# Patient Record
Sex: Female | Born: 1985 | Race: Asian | Hispanic: No | Marital: Single
Health system: Northeastern US, Academic
[De-identification: ages and names within clinical notes are randomized; demographics above are authoritative.]

## PROBLEM LIST (undated history)

## (undated) DIAGNOSIS — T7840XA Allergy, unspecified, initial encounter: Secondary | ICD-10-CM

## (undated) HISTORY — PX: EYE SURGERY: SHX253

## (undated) HISTORY — DX: Allergy, unspecified, initial encounter: T78.40XA

---

## 2012-03-18 ENCOUNTER — Ambulatory Visit: Payer: Self-pay | Admitting: Emergency Medicine

## 2012-03-18 VITALS — BP 107/72 | HR 84 | Temp 98.1°F | Resp 16 | Ht 66.0 in | Wt 121.0 lb

## 2012-03-18 DIAGNOSIS — H1010 Acute atopic conjunctivitis, unspecified eye: Secondary | ICD-10-CM

## 2012-03-18 MED ORDER — OLOPATADINE HCL 0.1 % OP SOLN: 1.0000 [drp] | Freq: Two times a day (BID) | OPHTHALMIC | Status: AC

## 2012-03-18 NOTE — Patient Instructions (Signed)

## 2012-03-18 NOTE — Progress Notes (Signed)
[facility] [address] [place] [address] [phone]  Date:  03/18/2012   Name:  Theresa Myers   DOB:  1985/04/14   MRN:  [id number]  PCP:  No primary provider on file.    Chief Complaint: Allergic Reaction   History of Present Illness:  Theresa Myers is a 27 y.o. very pleasant female patient who presents with the following:  Has allergy to cat and dog dander that is mild and not usually having allergic symptoms.  Noticed swelling in left eye suddenly today and took some benadryl.  Improved over the next few hours.  Had no visual symptoms but conjunctival injection and lid edema.  No wheezing, cough, difficulty breathing or shortness of breath.  No rash.  There is no problem list on file for this patient.   Past Medical History  Diagnosis Date  . Allergy     Past Surgical History  Procedure Date  . Eye surgery     History  Substance Use Topics  . Smoking status: Never Smoker   . Smokeless tobacco: Never Used  . Alcohol Use: No    History reviewed. No pertinent family history.  No Known Allergies  Medication list has been reviewed and updated.  Current Outpatient Prescriptions on File Prior to Visit  Medication Sig Dispense Refill  . cetirizine (ZYRTEC) 10 MG tablet Take 10 mg by mouth daily.        Review of Systems:  As per HPI, otherwise negative.    Physical Examination: Filed Vitals:   03/18/12 1403  BP: 107/72  Pulse: 84  Temp: 98.1 F (36.7 C)  Resp: 16   Filed Vitals:   03/18/12 1403  Height: 5\' 6"  (1.676 m)  Weight: 121 lb (54.885 kg)   Body mass index is 19.53 kg/(m^2). Ideal Body Weight: Weight in (lb) to have BMI = 25: 154.6   GEN: WDWN, NAD, Non-toxic, A & O x 3 HEENT: Atraumatic, Normocephalic. Neck supple. No masses, No LAD.  Injection left conjunctiva.  No drainage. Some lid edema. Ears and Nose: No external deformity. CV: RRR, No M/G/R. No JVD. No thrill. No extra heart sounds. PULM: CTA  B, no wheezes, crackles, rhonchi. No retractions. No resp. distress. No accessory muscle use. ABD: S, NT, ND, +BS. No rebound. No HSM. EXTR: No c/c/e NEURO Normal gait.  PSYCH: Normally interactive. Conversant. Not depressed or anxious appearing.  Calm demeanor.    Assessment and Plan: Atopic conjunctivitis patanol Follow up as needed  Carmelina Dane, MD

## 2013-07-12 ENCOUNTER — Ambulatory Visit (INDEPENDENT_AMBULATORY_CARE_PROVIDER_SITE_OTHER): Payer: Managed Care, Other (non HMO) | Admitting: Family Medicine

## 2013-07-12 VITALS — BP 98/64 | HR 79 | Temp 98.1°F | Resp 16 | Ht 66.0 in | Wt 114.8 lb

## 2013-07-12 DIAGNOSIS — R350 Frequency of micturition: Secondary | ICD-10-CM

## 2013-07-12 LAB — POCT URINE PREGNANCY: Preg Test, Ur: NEGATIVE

## 2013-07-12 LAB — POCT URINALYSIS DIPSTICK
Bilirubin, UA: NEGATIVE
Glucose, UA: NEGATIVE
Ketones, UA: NEGATIVE
Leukocytes, UA: NEGATIVE
Nitrite, UA: NEGATIVE
Protein, UA: NEGATIVE
Spec Grav, UA: 1.015
Urobilinogen, UA: 0.2
pH, UA: 5.5

## 2013-07-12 MED ORDER — CIPROFLOXACIN HCL 250 MG PO TABS: 250.0000 mg | ORAL_TABLET | Freq: Two times a day (BID) | ORAL | Status: AC

## 2013-07-12 NOTE — Progress Notes (Addendum)
° °  Subjective:  This chart was scribed for Elvina SidleKurt Lauenstein, MD  by Ashley JacobsBrittany Andrews, [facility] Scribe. The patient was seen in room and the patient's care was started at 1:56 PM.  Patient ID: Theresa Myers, female    DOB: [birth year], 28 y.o.   MRN: [id number]  07/12/2013  Urinary Frequency   Urinary Frequency  Associated symptoms include frequency. Pertinent negatives include no discharge, hematuria, nausea or vomiting. There is no history of recurrent UTIs.   HPI Comments: Theresa Myers is a 28 y.o. female who [facility] complaining of urinary frequency since last night. Pt mentions that yesterday she has associated lower abdomen discomfort, pale/yellow/clear urination, no discharge or odor, and diffuse lumbar aching. Prior to her symptoms she suspect that she had a "stomach bug" that lasted approximately 16 hours. She had abdominal cramps, nausea, vomiting, and diarrhea which has since resolved. She reports drinking plenty of fluids and drinking chicken broth. Denies prior UTI.   She is a Research scientist (life sciences)pastry chef.  Review of Systems  Gastrointestinal: Positive for abdominal pain. Negative for nausea, vomiting and diarrhea.  Genitourinary: Positive for frequency. Negative for hematuria and vaginal discharge.  Musculoskeletal: Positive for back pain.  All other systems reviewed and are negative.   Past Medical History  Diagnosis Date   Allergy    No Known Allergies Current Outpatient Prescriptions  Medication Sig Dispense Refill   cetirizine (ZYRTEC) 10 MG tablet Take 10 mg by mouth daily.       olopatadine (PATANOL) 0.1 % ophthalmic solution Place 1 drop into the left eye 2 (two) times daily.  5 mL  12   No current facility-administered medications for this visit.       Objective:    BP 98/64   Pulse 79   Temp(Src) 98.1 F (36.7 C) (Oral)   Resp 16   Ht 5\' 6"  (1.676 m)   Wt 114 lb 12.8 oz (52.073 kg)   BMI 18.54 kg/m2    SpO2 98%   LMP 07/10/2013 Physical Exam No acute distress Thin adult woman who is cooperative. HEENT: Unremarkable No CVA tenderness Abdomen soft, nontender without HSM   Assessment & Plan:   Urinary frequency - Plan: POCT urine pregnancy, POCT urinalysis dipstick, Urine culture, ciprofloxacin (CIPRO) 250 MG tablet  Signed, Elvina SidleKurt Lauenstein, MD

## 2013-07-14 ENCOUNTER — Telehealth: Payer: Self-pay

## 2013-07-14 ENCOUNTER — Ambulatory Visit (INDEPENDENT_AMBULATORY_CARE_PROVIDER_SITE_OTHER): Payer: Managed Care, Other (non HMO) | Admitting: Physician Assistant

## 2013-07-14 VITALS — BP 100/70 | HR 96 | Temp 98.2°F | Resp 16 | Ht 65.5 in | Wt 114.0 lb

## 2013-07-14 DIAGNOSIS — R319 Hematuria, unspecified: Secondary | ICD-10-CM

## 2013-07-14 DIAGNOSIS — R350 Frequency of micturition: Secondary | ICD-10-CM

## 2013-07-14 DIAGNOSIS — N12 Tubulo-interstitial nephritis, not specified as acute or chronic: Secondary | ICD-10-CM

## 2013-07-14 DIAGNOSIS — R300 Dysuria: Secondary | ICD-10-CM

## 2013-07-14 LAB — POCT CBC
Granulocyte percent: 77.4 %G (ref 37–80)
HCT, POC: 42.2 % (ref 37.7–47.9)
Hemoglobin: 14 g/dL (ref 12.2–16.2)
Lymph, poc: 1.4 (ref 0.6–3.4)
MCH, POC: 32.5 pg — AB (ref 27–31.2)
MCHC: 33.2 g/dL (ref 31.8–35.4)
MCV: 97.9 fL — AB (ref 80–97)
MID (cbc): 0.4 (ref 0–0.9)
MPV: 8.1 fL (ref 0–99.8)
POC Granulocyte: 6.2 (ref 2–6.9)
POC LYMPH PERCENT: 18.1 %L (ref 10–50)
POC MID %: 4.5 %M (ref 0–12)
Platelet Count, POC: 288 10*3/uL (ref 142–424)
RBC: 4.31 M/uL (ref 4.04–5.48)
RDW, POC: 12.9 %
WBC: 8 10*3/uL (ref 4.6–10.2)

## 2013-07-14 LAB — POCT URINALYSIS DIPSTICK
Bilirubin, UA: NEGATIVE
Glucose, UA: NEGATIVE
Ketones, UA: NEGATIVE
Nitrite, UA: NEGATIVE
PROTEIN UA: 100
UROBILINOGEN UA: 0.2
pH, UA: 5.5

## 2013-07-14 LAB — POCT UA - MICROSCOPIC ONLY
CRYSTALS, UR, HPF, POC: NEGATIVE
Casts, Ur, LPF, POC: NEGATIVE
Mucus, UA: NEGATIVE
YEAST UA: NEGATIVE

## 2013-07-14 LAB — URINE CULTURE: Colony Count: >=100000

## 2013-07-14 MED ORDER — AMOXICILLIN-POT CLAVULANATE 875-125 MG PO TABS: 1.0000 | ORAL_TABLET | Freq: Two times a day (BID) | ORAL | Status: AC

## 2013-07-14 MED ORDER — CEFTRIAXONE SODIUM 1 G IJ SOLR
1.0000 g | Freq: Once | INTRAMUSCULAR | Status: AC
  Administered 2013-07-14: 1 g via INTRAMUSCULAR

## 2013-07-14 NOTE — Telephone Encounter (Signed)
PT CALLED, STATES WAS SEEN BY DR Milus GlazierLAUENSTEIN ON Tuesday 07/12/13 AND WAS DIAGNOSED WITH A "MILD UTI" AND WAS PRESCRIBED AN ANTIBIOTIC. PT STATES SHE IS ON DAY 3 OF RX AND STILL HAVING DISCOMFORT AND NOW HAS BLOOD IN HER URINE. WANTS TO KNOW IF SHE NEEDS A STRONGER RX

## 2013-07-14 NOTE — Telephone Encounter (Signed)
Pt RTC this evening.

## 2013-07-14 NOTE — Progress Notes (Signed)
   Subjective:    Patient ID: Theresa Myers, female    DOB: 05-Oct-1985, 28 y.o.   MRN: [id number]  HPI 28 year old female presents today for recheck of UTI.  Was seen on 07/12/13 and diagnosed with UTI - started on Cipro 250 mg bid x 3 days.  Is here today because she is worsening - noticed hematuria and small clots in her urine today. Continues to have urinary frequency, dysuria, and suprapubic abdominal pain. Had significant pain last night that was not relieved with ibuprofen 600 mg. Had leftover pain medicine from dental procedure - took 1/2 a dose which helped. Admits to nausea without vomiting.  No documented fever but may be masked due to ibuprofen.   No hx of UTI's in the past.  Patient is otherwise healthy with no other concerns today.     Review of Systems  Constitutional: Positive for chills. Negative for fever.  Gastrointestinal: Positive for nausea and abdominal pain. Negative for vomiting.  Genitourinary: Positive for dysuria, hematuria and flank pain.       Objective:   Physical Exam  Constitutional: She is oriented to person, place, and time. She appears well-developed and well-nourished.  HENT:  Head: Normocephalic and atraumatic.  Right Ear: External ear normal.  Left Ear: External ear normal.  Eyes: Conjunctivae are normal.  Neck: Normal range of motion.  Cardiovascular: Normal rate.   Pulmonary/Chest: Effort normal.  Abdominal: There is tenderness in the suprapubic area. There is CVA tenderness (bilaterally).  Neurological: She is alert and oriented to person, place, and time.  Psychiatric: She has a normal mood and affect. Her behavior is normal. Judgment and thought content normal.     Urine culture showed E. Coli resistant to Cipro, Levaquin, and Septra.     Assessment & Plan:  Frequent urination - Plan: POCT urinalysis dipstick, POCT UA - Microscopic Only  Pyelonephritis - Plan: cefTRIAXone (ROCEPHIN) injection 1 g, POCT CBC  Rocephin 1 gram IM  today. CBC normal today which is reassuring.   Stop Cipro and start Augmentin 875 mg bid x 14 days Push fluids.  RTC precautions discussed Recheck if symptoms worsen or fail to improve.

## 2013-08-19 ENCOUNTER — Ambulatory Visit: Payer: Self-pay

## 2013-08-19 ENCOUNTER — Other Ambulatory Visit: Payer: Self-pay | Admitting: Occupational Medicine

## 2013-08-19 DIAGNOSIS — M79642 Pain in left hand: Secondary | ICD-10-CM

## 2015-04-28 IMAGING — CR DG HAND COMPLETE 3+V*L*
3 series · 3 of 3 positions shown · non-contrast
Comparison: None.

CLINICAL DATA: Crush injury of the hand now with pain in the thumb
and fourth and fifth digits

EXAM:
LEFT HAND - COMPLETE 3+ VIEW

[view not recorded (1 of 3)]
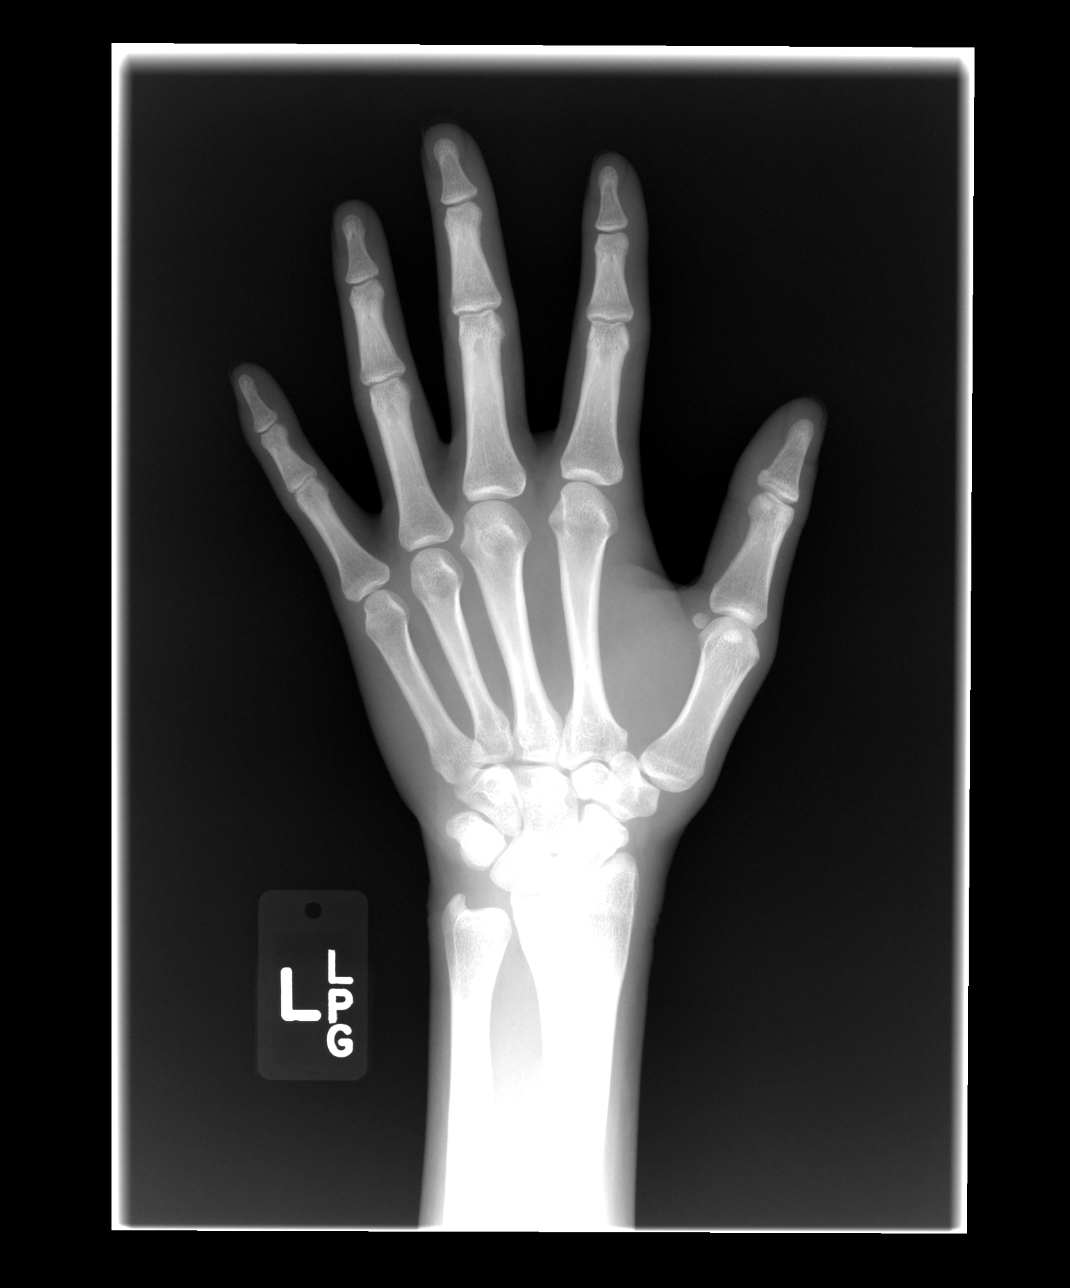

[view not recorded (2 of 3)]
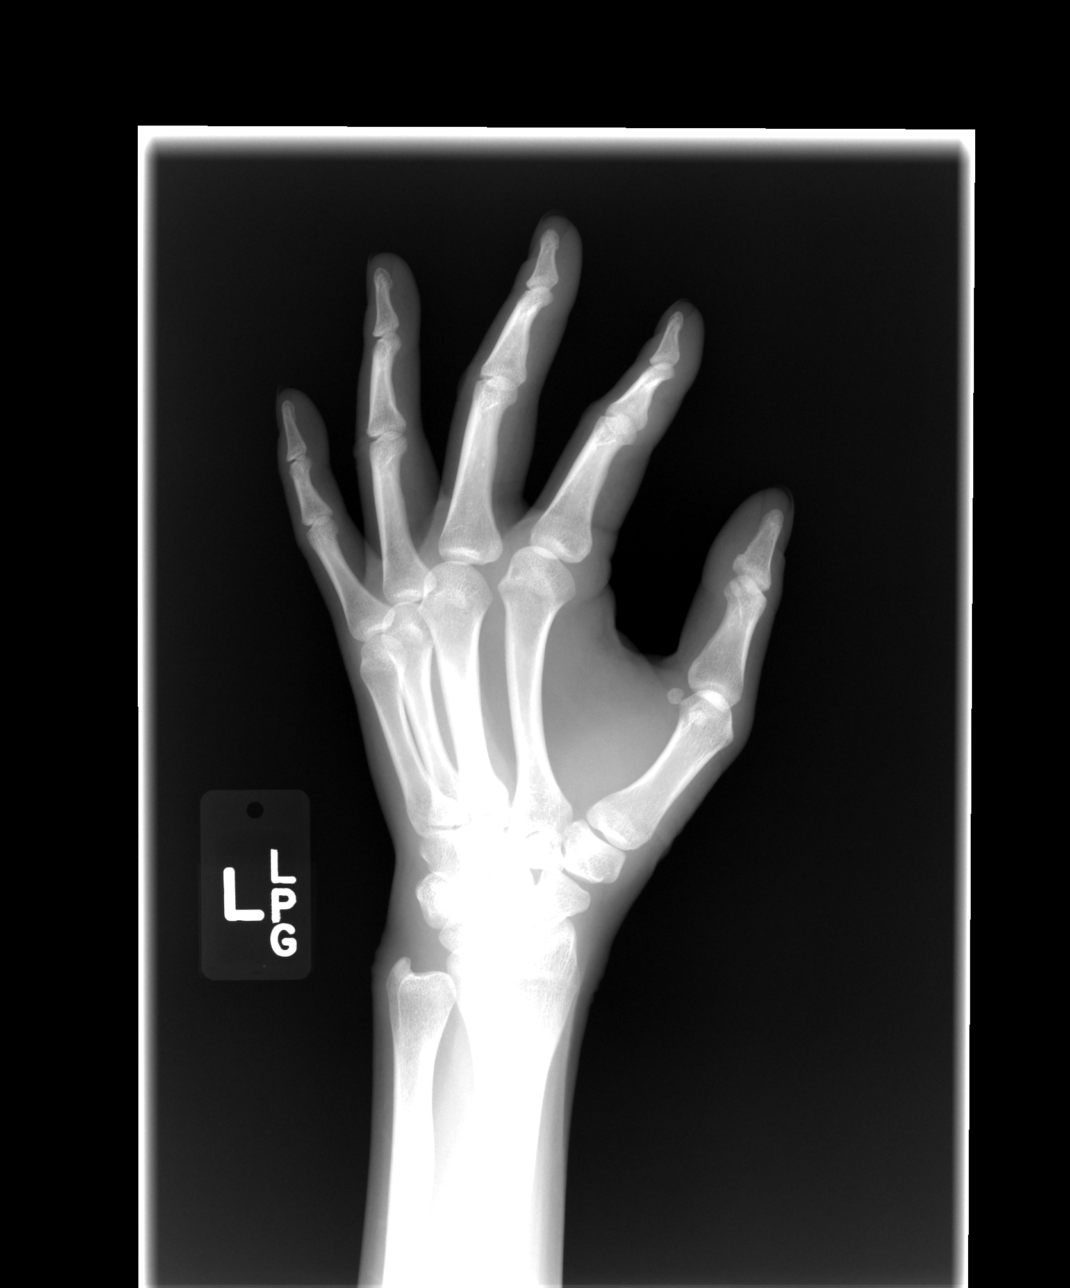

[view not recorded (3 of 3)]
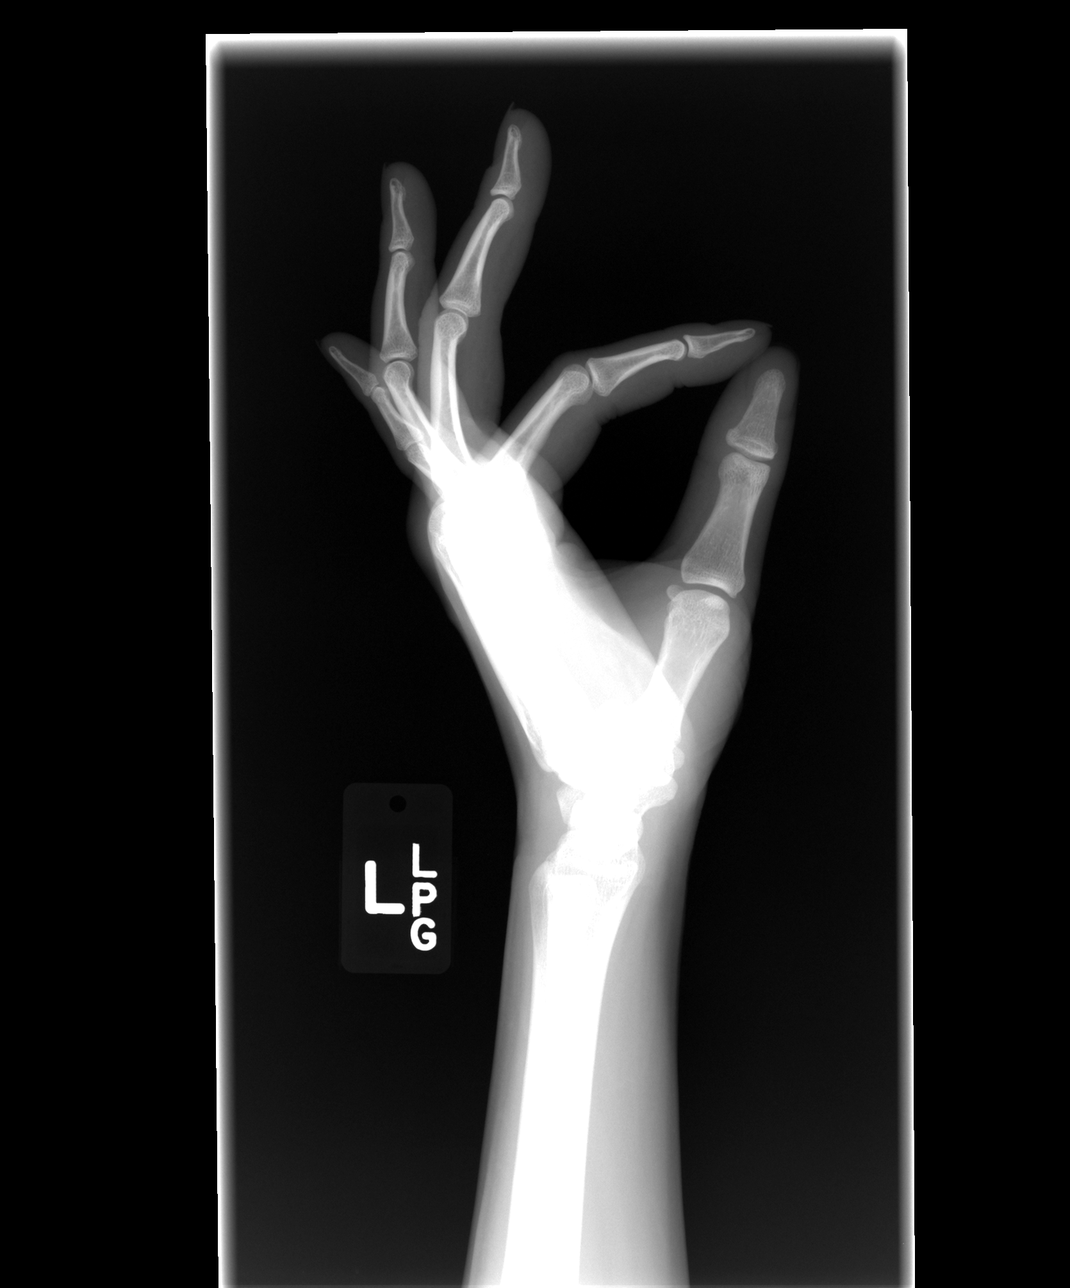

[3 of 3 positions shown; findings below may reference images not displayed]

FINDINGS: The bones are adequately mineralized. There is no acute fracture nor
dislocation. The interphalangeal and metacarpo phalangeal and
carpometacarpal joints are normal. The overlying soft tissues are
normal. The distal radius and ulna exhibit no acute abnormalities.
IMPRESSION: There is no acute bony abnormality of the right hand.

## 2016-02-06 ENCOUNTER — Ambulatory Visit

## 2016-02-06 ENCOUNTER — Ambulatory Visit: Admitting: Obstetrics & Gynecology

## 2016-02-06 LAB — HX CBC W/DIFFX
HX ABSOLUTE BASOPHILS COUNT: 0.05 10*3/uL (ref 0.0–0.33)
HX ABSOLUTE EOSINOPHIL COUNT: 0.02 10*3/uL (ref 0.0–0.88)
HX ABSOLUTE LYMPHS COUNT: 1.41 10*3/uL (ref 0.99–4.84)
HX ABSOLUTE MONOCYTES COUNT: 0.37 10*3/uL (ref 0.18–1.21)
HX ABSOLUTE NEUTROPHIL CNT: 8.7 10*3/uL — ABNORMAL HIGH (ref 1.8–7.7)
HX BASOS: 0.5 %
HX EOS: 0.2 %
HX HEMATOCRIT: 41 % (ref 36.0–46.0)
HX HEMOGLOBIN: 13.6 g/dL (ref 12.0–16.0)
HX IMMATURE GRANULOCYTES: 0.4 % (ref 0.0–1.0)
HX LYMPHS: 13.3 %
HX MEAN CORP.HEMO.CONC.: 33.2 g/dL (ref 31.0–37.0)
HX MEAN CORPUSCULAR HEMOGLOBIN: 32.2 pg (ref 26.0–34.0)
HX MEAN CORPUSCULAR VOLUME: 97.2 fL (ref 80.0–100.0)
HX MEAN PLATELET VOLUME: 9.2 fL — ABNORMAL LOW (ref 9.4–12.4)
HX MONOS: 3.5 %
HX PLATELET COUNT: 287 10*3/uL (ref 150.0–400.0)
HX POLYS: 82.1
HX RED BLOOD COUNT: 4.2 M/uL (ref 4.0–5.2)
HX RED CELL DISTRIBUTION WIDTH SD: 46.3 fL (ref 35.0–51.0)
HX WHITE BLOOD COUNT: 10.6 10*3/uL (ref 4.5–11.0)

## 2016-02-06 LAB — HX RAPID PLASMA REAGIN: HX RAPID PLASMA REAGIN: NONREACTIVE

## 2016-02-06 LAB — HX SERUM HCG BETA SUBUNIT - QUANT: HX SERUM HCG BETA SUBUNIT - QUANT: <5 (ref <5)

## 2016-02-06 NOTE — Progress Notes (Signed)
Clinical Lists Changes    Orders:  Added new Test order of US - Transvaginal (US-TV) - Signed

## 2016-02-06 NOTE — Progress Notes (Signed)
GYN Visit    Chaperone: dee      Vital Signs   Weight: 135.13 lb. (61.42 kg.)  Height: 66 in. (167.64 cm.)    Body Mass Index: 21.89  Body Surface Area (m2): 1.69    LMP: 01/28/2016      Blood Pressure #1: 90 / 58 mm Hg (left arm)    GYN Preventive Testing   Mammogram: denies Bone Density: denies Pap: done in [place] Date: 2010  Colonoscopy: denies No Translator Needed   Kaleen Mask [place].....................Marland KitchenDecember  6, 2017 11:26 AM      History of Present Illness:    Chief Complaint: new patient annual exam and papsmear  HPI: menses are regular, heavy and lasts about 9 days   c/o LLQ pain x 2 days  desires to be checked for all STD's  pt   states  she  uses  condoms    pt  states  she  has had  gardasil   in  past     was  adopted    no  hx  of  family available  Perfoms Self Breast Exam: Yes  Given instructions: Yes  GYN History:   Patient's Age: 30 Years Old  Gravida: 1  TAB:  1  Living Children: 0  Menarche (yrs):  11  Cycle: regular  Length of period: 9  Interval b/n periods: 28  LMP: 01/28/2016  Sexually Active? Yes  Satisfied with Sex? Yes  Partners Female  Unprotected Sex? Some  Current Contraceptive:   Condoms            Current Medications - Reviewed Today  ZYRTEC ALLERGY TABLET (CETIRIZINE HCL TABS) prn    Current Allergies - Reviewed Today  DOXYCYCLINE (DOXYCYCLINE HYCLATE).        Past Medical History  allergies    Obstetric History  G1P0, tab x 1    Surgical History  breast implants-negative  replacing lower eyelid for corneal tears    Family History  DES-unknown--patient is adopted  unknown family hx for breast Ca      Risk Factors  Tobacco Use:  current everyday smoker  Alcohol Use:  yes  Substance Abuse:  no           Violence Screening   Do you feel safe in your current relationship? Yes  Is a partner from a previous relationship making you feel unsafe now? No  Have you ever been hit, kicked, puched or otherwise hurt by someone? No  Have you ever been forced to have sex when you don't want to?  No      Review of Systems   General: Denies fevers, chills, sweats, anorexia, fatigue, malaise, weight loss,headache.   Eyes: Denies blurring, diplopia, irritation, discharge, vision loss, eye pain, photophobia.   Ears/Nose/Throat: Denies earache, ear discharge, tinnitus, decreased hearing, nasal congestion, nosebleeds, sore throat, hoarseness, dysphagia.   Gastrointestinal: Denies nausea, vomiting, diarrhea, constipation, change in bowel habits, abdominal pain, melena, hematochezia, jaundice.   Genitourinary: Denies vaginal discharge, incontinence, dysuria, hematuria, urinary frequency, amenorrhea, menorrhagia, abnormal vaginal bleeding, pelvic pain.   Skin: Denies rash, itching, dryness, suspicious lesions.   Psychiatric: Denies depression, anxiety, memory loss,mental disturbance,suicidal ideation,hallucinations,paranoia.   Endocrine: Denies cold intolerance, heat intolerance, polydipsia, polyphagia, polyuria, weight change.   Allergic/Immunologic: Denies persistent infections, HIV exposure.   All other pertinent systems were reviewed and are negative       Physical Exam     Constitutional: Alert, no acute distress, well hydrated, well  developed, well nourished, appropriate dress,non-ill appearing.   Neck: supple, no adenopathy, no masses, thyroid normal size, no thyroid tenderness or nodules,.   Breasts: no asymmetry, no skin changes, no nipple discharge,no masses,no tenderness.   Abdomen: nondistended, nontender, no guarding, normal BS, no hepatosplenomegaly, no hernias, no CVA tenderness bilaterally.   Vulva: Normal appearance, normal hair distribution, no lesions or masses.   Vagina: Normal, rugated, physiologic discharge, no lesions, no masses, adequate pelvic support.   Cervix: Normal, no motion tenderness, no lesions. PT  STATES   NO  ONE   IN  PAST  HAS  MENTIONED   CERVIX  TO  HAVE  COCKSCOMB  APPEARENCE    [PT  HAS  NO  FAMILY  HX   NO  HX  OF   DES  EXPOSURE   Uterus: smooth, mobile, non-tender,  adequate support, firm, no prolapse, average size.   Adnexa: L ADNEXAL TENDERNESS. QUESTION  OF  CYST    WILL DO  [place]    Pap done: Yes  HPV Done: Yes    Assessment and Plan:      ~ ANNUAL GYNECOLOGICAL EXAMINATION:    pt requesing  std   testing  will do   hiv  and  vdrl   and  beta  sub    pt   flying  out  in  [place]  advised  to  be  sure to  call office  rfor  results  risk  of  ectopic  discussed     ~ PELVIC PAIN:    pt   states  pain    2  days   no  abn   discharge      will  book  for  [place]  and   follow up              Orders:  Added new Service order of Patient Encounter ([id number]) - Signed  Added new Test order of CBCD -CBC with Diff** (CBCD) - Signed  Added new Test order of HCG - Quant ** (HCG) - Signed  Added new Test order of HIV  (HIV) - Signed  Added new Test order of RPR (RPR) - Signed  Added new Test order of PAP/HPV (231) - Signed  Added new Test order of GENP -Genprobe (GC&CHLM) (GENP) - Signed  Added new Test order of Ureaplasma- [facility] (UREA) - Signed  Added new Test order of Mycoplasma - [facility] (MYCO) - Signed  Added new Test order of BDAF- BD Affirm  Converge (BDAF) - Signed  Added new Service order of GYN New, (18-39) ([id number]) - Signed  Added new Service order of Pap Smear (reimbursed) ([id number]) - Signed

## 2016-02-07 ENCOUNTER — Ambulatory Visit

## 2016-02-07 ENCOUNTER — Ambulatory Visit: Admitting: Obstetrics & Gynecology

## 2016-02-07 LAB — HX HPV WITH REFLEX TO GENOTYPE: HX HPV WITH REFLEX TO GENOTYPE: POSITIVE — AB

## 2016-02-07 LAB — HX GENPROBE (CHLAM/GC AMPLIFIED)
HX GENPROBE CHLAMYDIA-AMPLIFIED: POSITIVE — AB
HX GENPROBE GC AMPLIFIED: NEGATIVE

## 2016-02-07 LAB — HX HIV ANTIGEN & ANTIBODY SCREEN: HX HIV ANTIGEN & ANTIBODY SCREEN: NONREACTIVE

## 2016-02-08 ENCOUNTER — Ambulatory Visit

## 2016-02-08 ENCOUNTER — Ambulatory Visit: Admitting: Obstetrics & Gynecology

## 2016-02-08 LAB — HX HPV GENOTYPING
HX HPV 16: NEGATIVE
HX HPV 18/45: NEGATIVE

## 2016-02-08 LAB — HX MYCOPLASMA HOMINIS PCR: HX MYCOPLASMA HOMINIS: NEGATIVE

## 2016-02-08 LAB — HX UREAPLASMA SPECIES, PCR
HX UREAPLASMA PARVUM PCR: POSITIVE — AB
HX UREAPLASMA UREALYTICUM PCR: NEGATIVE

## 2016-02-08 NOTE — Telephone Encounter (Signed)
Phone Note -     Outgoing Call    Initial call taken by: Earvin Hansen Matthews,  February 08, 2016 9:27 AM  Summary of Call: Pt aware needs to start medication for vaginal infection, will call meds in pharmacy pt is in [phone]  Medication call in pharmacy Erythromycin 500 mg 1 tab po four times daily-Metrogel 0.75 mg One applicator fulll intravaginal at bed time x 5 nights-Terazol 3 One applicator full intravaginal at bed time.      Follow-up #1  Details: i  spoke  to  pt  and  she   is  aware  of  infection  and   need   for  follow  up  and  therapy  adviswed  condoms  and  [place]  and  advisd  to  got o  hospital  if    any  pain  or      temp  or  any  problems   Action: Patient called  By: Kathe Becton MD ~ February 08, 2016 10:51 AM

## 2016-02-11 ENCOUNTER — Ambulatory Visit

## 2016-02-11 ENCOUNTER — Ambulatory Visit: Admitting: Obstetrics & Gynecology

## 2016-02-11 NOTE — Progress Notes (Signed)
GYN Visit    Chaperone: office      Vital Signs   Weight: 136.13 lb. (61.88 kg.)  Height: 66 in. (167.64 cm.)    Body Mass Index: 22.05  Body Surface Area (m2): 1.70    LMP: 01/28/2016      Blood Pressure #1: 92 / 50 mm Hg (left arm)    GYN Preventive Testing   Ultrasound: Done Date: 02/06/2016  No Translator Needed   Kaleen Mask [place].....................Marland KitchenDecember 11, 2017 9:50 AM      History of Present Illness:    Chief Complaint: follow-up labs, u/s & cultures  HPI: discuss labs  pt   cbc   was   nl,  vdrl  neg   hiv  neg   ultrasound    was neg    cultures  pt  has  pos  chlamydia  and    is  on  emycin   due  to  allergies   pt  aware that  this  will be  reported  to  state   advised   condoms   and  to have  partner  to  be   treated  pt  states  she  has   notified  him  pt  has   pos   ureaplasma   and  is   on    e mycin   will  book  test  of   cure  in  6 weeks   pt  states     no  pain ,  no  temp   pt   pap  is  pending   will call her    with results  when  reported   Given instructions: Yes  GYN History:   Patient's Age: 30 Years Old  Gravida: 1  TAB:  1  Living Children: 0  Menarche (yrs):  11  Cycle: regular  Length of period: 9  Interval b/n periods: 28  Sexually Active? Yes  Satisfied with Sex? Yes  Partners Female  Unprotected Sex? Some  History of STD's? Yes  Current Contraceptive:   Condoms  Contraceptive History:   Condoms        Past Medical History  allergies  pos  chlamydia 12- 2017    Obstetric History  G1P0, tab x 1    Surgical History  breast implants-negative  replacing lower eyelid for corneal tears    Family History  DES-unknown--patient is adopted  unknown family hx for breast Ca      Risk Factors  Tobacco Use:  current everyday smoker  Type: Cigarette  Comments: advised to  quit   Alcohol Use:  yes  Substance Abuse:  no           Violence Screening   Do you feel safe in your current relationship? Yes  Is a partner from a previous relationship making you feel unsafe now? No  Have you  ever been hit, kicked, puched or otherwise hurt by someone? No  Have you ever been forced to have sex when you don't want to? No      Review of Systems   General: Denies fevers, chills, sweats, anorexia, fatigue, malaise, weight loss,headache.   Gastrointestinal: Denies nausea, vomiting, diarrhea, constipation, change in bowel habits, abdominal pain, melena, hematochezia, jaundice.   Genitourinary: Denies vaginal discharge, incontinence, dysuria, hematuria, urinary frequency, amenorrhea, menorrhagia, abnormal vaginal bleeding, pelvic pain.   All other pertinent systems were reviewed and are negative  Physical Exam       Assessment and Plan:      ~ CHLAMYDIAL INFECTION:    pt  on    e mycin     advised    condoms   pt  will  have    test  of  cure in  six  weeks   pt  was  rx  with    metrogel   for  bv   pt  states  no  temp    nor  pain       ~ UREAPLASMA:    pt  on  emycin   ~ SMOKER/TOBACCO USE DISORDER-SMOKING CESSATION DISCUSSED:    advised  to   quit     discussed   effects  on  pap     I spent approximately 30 minutes in which 50% or more of the time was spent counseling or coordinating care of the patient's condition.        Orders:  Added new Service order of Patient Encounter ([id number]) - Signed  Added new Service order of OV Est Level III ([id number]) - Signed

## 2016-03-24 ENCOUNTER — Ambulatory Visit: Admitting: Obstetrics & Gynecology

## 2016-03-24 ENCOUNTER — Ambulatory Visit

## 2016-03-24 NOTE — Progress Notes (Signed)
GYN Visit    Chaperone: Damaris Carro [place]      Vital Signs   Weight: 136.25 lb. (61.93 kg.)  Height: 66 in. (167.64 cm.)    Body Mass Index: 22.07  Body Surface Area (m2): 1.70    LMP: 02/24/2016      Blood Pressure #1: 96 / 64 mm Hg (left arm)    GYN Preventive Testing   Ultrasound: Done Date: 02/06/2016  No Translator Needed   Kaleen Mask [place]......................March 24, 2016 9:46 AM      History of Present Illness:    Chief Complaint: repeat cultures & f/u u/s  HPI: repeat-ureaplasma,chlamydia, gardnarella and candida cultures--finished E-mycin, Metrogel and Terazol  discuss ultrasound report  pt  has   taking  all   meds  and  partner    was  rx'd   pt  denies  any  discharge    and  will  have   test  of   cure   pt   also   had  pos  hpv  and  will  have  colpo   next  week  GYN History:   Patient's Age: 31 Years Old  Gravida: 1  TAB:  1  Living Children: 0  Menarche (yrs):  11  Cycle: regular  Length of period: 9  Interval b/n periods: 28  LMP: 02/24/2016  Sexually Active? Yes  Satisfied with Sex? Yes  Partners Female  Unprotected Sex? Some  History of STD's? 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)  SMOKER/TOBACCO USE DISORDER-SMOKING CESSATION DISCUSSED (ICD-305.1) (YNW29-F62.6)  UREAPLASMA (ICD-099.8) (ICD10-A63.8)  CHLAMYDIAL INFECTION (ICD-099.41) (ICD10-A74.9)  SCREENING PAP (ICD-V76.2) (ZHY86-V78.4)  SCREENING STD (ICD-V74.5) (ICD10-Z78.9)  PELVIC PAIN (ICD-625.9) (ICD10-R10.2)  ANNUAL GYNECOLOGICAL EXAMINATION (ICD-V72.3) (ICD10-Z01.419)    Current Medications (No new medications added) - Reviewed Today  ZYRTEC ALLERGY TABLET (CETIRIZINE HCL TABS) prn  AZITHROMYCIN 500 MG ORAL TABLET (AZITHROMYCIN) 2 tab at once po    Current Allergies (No new allergies added) - Reviewed Today  DOXYCYCLINE (DOXYCYCLINE HYCLATE), * ALMONDS.      Risk Factors  Tobacco Use:  current everyday smoker  Type:  Cigarette  Comments: advised to  quit   Alcohol Use:  yes  Substance Abuse:  no           Violence Screening   Do you feel safe in your current relationship? Yes  Is a partner from a previous relationship making you feel unsafe now? No  Have you ever been hit, kicked, puched or otherwise hurt by someone? No  Have you ever been forced to have sex when you don't want to? No      Review of Systems   General: Denies fevers, chills, sweats, anorexia, fatigue, malaise, weight loss,headache.   Gastrointestinal: Denies nausea, vomiting, diarrhea, constipation, change in bowel habits, abdominal pain, melena, hematochezia, jaundice.   Genitourinary: Denies vaginal discharge, incontinence, dysuria, hematuria, urinary frequency, amenorrhea, menorrhagia, abnormal vaginal bleeding, pelvic pain.       Physical Exam     Breasts: no asymmetry, no skin changes, no nipple discharge,no masses,no tenderness.   Vulva: Normal appearance, normal hair distribution, no lesions or masses.   Urethra: Normal, no masses, non-tender, no discharge.   Bladder: Normal, no masses, non-tender, non-distended.   Vagina: Normal, rugated, physiologic discharge, no lesions, no masses, adequate pelvic support.   Cervix: Normal, no motion tenderness, no lesions.  Uterus: smooth, mobile, non-tender, adequate support, firm, no prolapse, average size.   Adnexa: Normal, no masses, mobile, nontender.   Chlamydia culture done: Yes  GC culture done: Yes    Assessment and Plan:      ~ SMOKER/TOBACCO USE DISORDER-SMOKING CESSATION DISCUSSED:    pt  advised  of    risks  and  that  smoking   can  influence  pap   ~ CHLAMYDIAL INFECTION:    pt  states  her  partner  was  also  tested  and   rx'd    ~ SCREENING STD:    hx  of  pos  culture  and  will  have   test  of  cure of  chalamydia  culture  done   pt  advised  condoms    we  discussed  safe  sex   pt  has   appt   for   colpo  due  to  pos  hpv     I spent approximately 20 minutes in which 50% or more of the  time was spent counseling or coordinating care of the patient's condition.   ~ HPV:  pt will have colpo          Orders:  Added new Service order of Patient Encounter ([id number]) - Signed  Added new Test order of GENP -Genprobe (GC&CHLM) (GENP) - Signed  Added new Service order of GYN Est, (18-39) ([id number]) - Signed

## 2016-03-25 ENCOUNTER — Ambulatory Visit

## 2016-03-25 ENCOUNTER — Ambulatory Visit: Admitting: Obstetrics & Gynecology

## 2016-03-25 LAB — HX GENPROBE (CHLAM/GC AMPLIFIED)
HX GENPROBE CHLAMYDIA-AMPLIFIED: POSITIVE — AB
HX GENPROBE GC AMPLIFIED: NEGATIVE

## 2016-03-31 ENCOUNTER — Ambulatory Visit

## 2016-04-07 ENCOUNTER — Ambulatory Visit

## 2016-04-07 ENCOUNTER — Ambulatory Visit: Admitting: Obstetrics & Gynecology

## 2016-04-07 NOTE — Procedures (Signed)
Colposcopy Procedure    Chaperone: Karen Wood      Vital Signs   Weight: 134 lb. (60.91 kg.)  Height: 66 in. 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      Risk Factors  Tobacco Use:  current everyday smoker  Type: Cigarette  Comments: advised to  quit   Alcohol Use:  yes  Substance Abuse:  no           Violence Screening   Do you feel safe in your current relationship? Yes  Is a partner from a previous relationship making you feel unsafe now? No  Have you ever been hit, kicked, puched or otherwise hurt by someone? No  Have you ever been forced to have sex when you don't want to? No        Colposcopy   Other History:  Date of Pap Smear: 02/06/2016  Pap Smear: HPV detected  Indications: suspicion of HPV  The patient complains of the following symptoms: none.  Comments: consent signed    Colposcopic Examination  Areas Inspected with the Colposcope:    cervix, vaginal fornices, mid-vagina, distal vagina, vaginal cuff, vulva, perineum, perianal area  A vaginal speculum was inserted. The cervix and vaginal fornices were adequately visualized. Acetic acid was applied to the cervix and upper vagina.  Vulva and Surrounding Areas:   Vulvar findings:  no abnormalities seen    Vagina   Vagina: no abnormalities    Cervix   Squamocolumnar Junction: fully visualized  Vaginal Fornices: no abnormalities seen  Comments: pt      cervix   had    appearensce  of   cockscomb  [pt  denies   exposure  to  des    discussed   risks  and  need   for     annual  exams     Cervical Findings   Endocervical curettage done.                OTHER FINDINGS or COMMENTS    neg  acetic  acid   test  and   neg  leugol's    ecc   done    pt   aware  of  pos  hpv  and  info  was   given  to  pt     advised    condoms   The patient tolerated the procedure well.  Specimens sent for histological examination: ECC    Assessment  Colposcopic Assessment: Normal colposcopic findings    Plan   Instructions given pelvic rest, patient told to expect some bleeding  and/or discharge, to return to discuss results  Other plans or treatments:   plan  routine  follow  up  if   ecc  neg      condoms    pt  will need   test  of  cure   in   a  few  weeeks   for  chlamydial    infection   and  partner  was   treated   Return to office: if sx worsen or persist      Results   Exo: Normal    Pap: HPV    Plan: pt  has    no   gross  lesions    present       pt  had  ecc  done     neg   with  acetic  acid and  leugol's    Orders:  Added new Test order of COLPOSCOPY CERVIX VAG BX CERVIX ([id number]) - Signed  Added new Service order of Colposcopy w/ ecc ([id number]) - Signed  Added new Service order of Pregnancy Test ([id number]) - Signed  Added new Service order of No Charge Visit (21308) - Signed          Informed Consent Obtained yes

## 2016-04-10 ENCOUNTER — Ambulatory Visit: Admitting: Obstetrics & Gynecology

## 2016-04-10 ENCOUNTER — Ambulatory Visit

## 2016-04-22 ENCOUNTER — Ambulatory Visit

## 2016-04-22 NOTE — Progress Notes (Signed)
GYN Visit    Chaperone: office      Vital Signs   Weight: 138 lb. (62.73 kg.)  Height: 66 in. (167.64 cm.)    Body Mass Index: 22.35  Body Surface Area (m2): 1.71    LMP: 04/19/2016      Blood Pressure #1: 104 / 66 mm Hg (left arm)  HPV: Positive (02/06/2016 1:48:14 PM)      No Translator Needed   Karen Wood.....................Marland KitchenFebruary 20, 2018 8:51 AM      History of Present Illness:    Chief Complaint: follow-up Colp.  HPI: discuss pathology report,  pt   ecc    are  neg  will  repeat  pap  i n     six  months    pt   is  [pos   for  hpv  pt  aware   pt  will  be   back  for  test  of   cure  of  chlamydia  in   april \\par  pt  advised     to  use  condoms     pt  aware  that    she  may  be  reinfected  and  to  call if  any  problems     states  her  partner  was   treated    no problems.  Perfoms Self Breast Exam: Yes  Given instructions: Yes  GYN History:   Patient's Age: 31 Years Old  Gravida: 1  TAB:  1  Living Children: 0  Menarche (yrs):  11  Cycle: regular  Length of period: 9  Interval b/n periods: 28  LMP: 04/19/2016  Sexually Active? Yes  Satisfied with Sex? Yes  Partners Female  Unprotected Sex? Some  History of STD's? 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      Risk Factors  Tobacco Use:  current everyday smoker  Type: Cigarette  Comments: advised to  quit   Alcohol Use:  yes  Substance Abuse:  no           Violence Screening   Do you feel safe in your current relationship? Yes  Is a partner from a previous relationship making you feel unsafe now? No  Have you ever been hit, kicked, puched or otherwise hurt by someone? No  Have you ever been forced to have sex when you don't want to? No      Review of Systems   General: Denies fevers, chills, sweats, anorexia, fatigue, malaise, weight loss,headache.   Gastrointestinal: Denies nausea, vomiting, diarrhea, constipation, change in bowel habits, abdominal pain, melena, hematochezia, jaundice.   Genitourinary: Denies vaginal discharge, incontinence, dysuria, hematuria, urinary frequency, amenorrhea, menorrhagia, abnormal vaginal bleeding, pelvic pain.       Physical Exam     Constitutional: Alert,  no acute distress, well hydrated, well developed, well nourished, appropriate dress,non-ill appearing.     Assessment and Plan:      ~ HPV:    neg   colpo    follow  up  in six  months     condoms    ~ SMOKER/TOBACCO USE DISORDER-SMOKING CESSATION DISCUSSED:    discussed  risks  and  effects  on  pap   advised  to  quit   I spent approximately 10 minutes in which 50% or more of the time was spent counseling or coordinating care of the patient's condition.   ~ CHLAMYDIAL INFECTION:         will  follow  up  in  april  for  test  of  cure      ~ SAFE SEX AND STD PREVENTION COUNSELING:      advised       to  use  condoms         ~ PELVIC PAIN - Improved      pt  states  no  pain  and  menses  are  nl     I spent approximately 25 minutes in which 50% or more of the time was spent counseling or coordinating care of the patient's condition.        Orders:  Added new Service order of Patient Encounter ([id number]) - Signed  Added new Service order of OV Est Level IV  ([id number]) - Signed

## 2016-06-30 ENCOUNTER — Ambulatory Visit: Admitting: Obstetrics & Gynecology

## 2016-06-30 ENCOUNTER — Ambulatory Visit

## 2016-06-30 NOTE — Progress Notes (Signed)
GYN Visit    Chaperone: Damaris Carro [place]      Vital Signs   Weight: 135.13 lb. (61.42 kg.)  Height: 66 in. (167.64 cm.)    Body Mass Index: 21.89  Body Surface Area (m2): 1.69    LMP: 06/12/2016      Blood Pressure #1: 90 / 66 mm Hg (left arm)  HPV: Positive (02/06/2016 1:48:14 PM)      No Translator Needed   Kaleen Mask [place]....................Marland KitchenMarland KitchenApril 30, 2018 8:54 AM      History of Present Illness:    Chief Complaint: repeat culture   HPI: repeat Chlamydia culture today  no problems  pt   states    partner   was     rx     pt  has  been   having    sex   states   using  condoms   pt   was  advised  about  safe  sex     pt  had  colpo    aware  pos  hpov        will  repeat    pap  in  june   due  to  hx   Perfoms Self Breast Exam: Yes  Given instructions: Yes  GYN History:   Patient's Age: 31 Years Old  Gravida: 1  TAB:  1  Living Children: 0  Menarche (yrs):  11  Cycle: regular  Length of period: 9  Interval b/n periods: 28  LMP: 06/12/2016  Sexually Active? Yes  Satisfied with Sex? Yes  Partners Female  Unprotected Sex? Some  History of STD's? 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        Past Medical History  allergies  pos  chlamydia 12- 2017    Surgical History  breast implants-negative  replacing lower eyelid for corneal tears    Family History  DES-unknown--patient is adopted  unknown family hx for breast Ca      Risk Factors  Tobacco Use:  current everyday smoker  Type: Cigarette  Comments: advised to  quit     pt   states   i  pack    every    two  weeks    Alcohol Use:  yes  Substance Abuse:  no               Review of Systems   General: Denies fevers, chills, sweats, anorexia, fatigue, malaise, weight loss,headache.   Genitourinary: Denies vaginal discharge, incontinence, dysuria, hematuria, urinary frequency, amenorrhea, menorrhagia, abnormal vaginal bleeding, pelvic pain.   Musculoskeletal: Denies back pain, joint pain, joint swelling, muscle cramps, muscle weakness, stiffness, arthritis.       Physical Exam     Vulva: Normal appearance, normal hair distribution, no lesions or masses.  Vagina: Normal, rugated, physiologic discharge, no lesions, no masses, adequate pelvic support.   Cervix: Normal, no motion tenderness, no lesions.   Uterus: smooth, mobile, non-tender, adequate support, firm, no prolapse, average size.   Adnexa: Normal, no masses, mobile, nontender.   Chlamydia culture done: Yes  GC culture done: Yes  Comments:  affirm culture done    Assessment and Plan:      ~ SMOKER/TOBACCO USE DISORDER-SMOKING CESSATION DISCUSSED:    pt   advised   to  quit  and  risks  have  been  discussed  pt   offered    help  and  pt  will  see  pcp time   spent  discussing   smoking    10  mins    ~ CHLAMYDIAL INFECTION - Comment Only    pt  has    hx  of  pos   culture   she  is  in   for  repeat     discussed  safe  sex  and  to  use  condoms    ~ HPV:    pt  had  colpo  and   advised  condoms  and  will   book  repeat  pap  in  june   pt    states  she  had    gardasil     ~ STD SCREENING:    culture  for    chlamydia   done            Orders:  Added new Service  order of Patient Encounter ([id number]) - Signed  Added new Test order of GENP -Genprobe (GC&CHLM) (GENP) - Signed  Added new Test order of BDAF- BD Affirm  Converge (BDAF) - Signed  Added new Service order of OV Est Level III ([id number]) - Signed

## 2016-07-01 ENCOUNTER — Ambulatory Visit: Admitting: Obstetrics & Gynecology

## 2016-07-01 ENCOUNTER — Ambulatory Visit

## 2016-07-01 LAB — HX GENPROBE (CHLAM/GC AMPLIFIED)
HX GENPROBE CHLAMYDIA-AMPLIFIED: NEGATIVE
HX GENPROBE GC AMPLIFIED: NEGATIVE

## 2016-10-10 ENCOUNTER — Ambulatory Visit

## 2016-10-10 NOTE — Progress Notes (Signed)
Patient cancelled repap 10/13/16 due to being in [place] and will call back to reschedule when shws back in the area      Observations:  Added new observation of ENBSRVTYPENC: Quick Note (10/10/2016 11:46)

## 2020-05-29 NOTE — Teleconsult (Signed)
Phone Note -     Outgoing Call    Initial call taken by: Earvin Hansen [place],  February 08, 2016 9:27 AM  Summary of Call: Pt aware needs to start medication for vaginal infection, will call meds in pharmacy pt is in [phone]  Medication call in pharmacy Erythromycin 500 mg 1 tab po four times daily-Metrogel 0.75 mg One applicator fulll intravaginal at bed time x 5 nights-Terazol 3 One applicator full intravaginal at bed time.      Follow-up #1  Details: i  spoke  to  pt  and  she   is  aware  of  infection  and   need   for  follow  up  and  therapy  adviswed  condoms  and  [place]  and  advisd  to  got o  hospital  if    any  pain  or      temp  or  any  problems   Action: Patient called  By: Kathe Becton MD ~ February 08, 2016 10:51 AM

## 2020-05-29 NOTE — Procedures (Signed)
Colposcopy Procedure    Chaperone: Damaris Carro [place]      Vital Signs   Weight: 134 lb. (60.91 kg.)  Height: 66 in. 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      Risk Factors  Tobacco Use:  current everyday smoker  Type: Cigarette  Comments: advised to  quit   Alcohol Use:  yes  Substance Abuse:  no           Violence Screening   Do you feel safe in your current relationship? Yes  Is a partner from a previous relationship making you feel unsafe now? No  Have you ever been hit, kicked, puched or otherwise hurt by someone? No  Have you ever been forced to have sex when you don't want to? No        Colposcopy   Other History:  Date of Pap Smear: 02/06/2016  Pap Smear: HPV detected  Indications: suspicion of HPV  The patient complains of the following symptoms: none.  Comments: consent signed    Colposcopic Examination  Areas Inspected with the Colposcope:    cervix, vaginal fornices, mid-vagina, distal vagina, vaginal cuff, vulva, perineum, perianal area  A vaginal speculum was inserted. The cervix and vaginal fornices were adequately visualized. Acetic acid was applied to the cervix and upper vagina.  Vulva and Surrounding Areas:   Vulvar findings:  no abnormalities seen    Vagina   Vagina: no abnormalities    Cervix   Squamocolumnar Junction: fully visualized  Vaginal Fornices: no abnormalities seen  Comments: pt      cervix   had    appearensce  of   cockscomb  [pt  denies   exposure  to  des    discussed   risks  and  need   for     annual  exams     Cervical Findings   Endocervical curettage done.                OTHER FINDINGS or COMMENTS    neg  acetic  acid   test  and   neg  leugol's    ecc   done    pt   aware  of  pos  hpv  and  info  was   given  to  pt     advised    condoms   The patient tolerated the procedure well.  Specimens sent for histological examination: ECC    Assessment  Colposcopic Assessment: Normal colposcopic findings    Plan   Instructions given pelvic rest, patient told to expect some bleeding  and/or discharge, to return to discuss results  Other plans or treatments:   plan  routine  follow  up  if   ecc  neg      condoms    pt  will need   test  of  cure   in   a  few  weeeks   for  chlamydial    infection   and  partner  was   treated   Return to office: if sx worsen or persist      Results   Exo: Normal    Pap: HPV    Plan: pt  has    no   gross  lesions    present       pt  had  ecc  done     neg   with  acetic  acid and  leugol's    Orders:  Added new Test order of COLPOSCOPY CERVIX VAG BX CERVIX ([id number]) - Signed  Added new Service order of Colposcopy w/ ecc ([id number]) - Signed  Added new Service order of Pregnancy Test ([id number]) - Signed  Added new Service order of No Charge Visit (21308) - Signed          Informed Consent Obtained yes
